# Patient Record
Sex: Female | Born: 1965 | Race: White | Hispanic: No | Marital: Married
Health system: Southern US, Community
[De-identification: ages and names within clinical notes are randomized; demographics above are authoritative.]

---

## 2019-12-01 ENCOUNTER — Emergency Department: Payer: Self-pay

## 2019-12-01 ENCOUNTER — Emergency Department
Admission: EM | Admit: 2019-12-01 | Discharge: 2019-12-01 | Disposition: A | Discharge status: Home / Self Care | Payer: Self-pay | Attending: Emergency Medicine | Admitting: Emergency Medicine

## 2019-12-01 ENCOUNTER — Other Ambulatory Visit: Payer: Self-pay

## 2019-12-01 DIAGNOSIS — Y9301 Activity, walking, marching and hiking: Secondary | ICD-10-CM | POA: Insufficient documentation

## 2019-12-01 DIAGNOSIS — Y92511 Restaurant or cafe as the place of occurrence of the external cause: Secondary | ICD-10-CM | POA: Insufficient documentation

## 2019-12-01 DIAGNOSIS — M47812 Spondylosis without myelopathy or radiculopathy, cervical region: Secondary | ICD-10-CM | POA: Insufficient documentation

## 2019-12-01 DIAGNOSIS — S39012A Strain of muscle, fascia and tendon of lower back, initial encounter: Secondary | ICD-10-CM | POA: Insufficient documentation

## 2019-12-01 DIAGNOSIS — M1812 Unilateral primary osteoarthritis of first carpometacarpal joint, left hand: Secondary | ICD-10-CM | POA: Insufficient documentation

## 2019-12-01 DIAGNOSIS — M47816 Spondylosis without myelopathy or radiculopathy, lumbar region: Secondary | ICD-10-CM | POA: Insufficient documentation

## 2019-12-01 DIAGNOSIS — S8001XA Contusion of right knee, initial encounter: Secondary | ICD-10-CM | POA: Insufficient documentation

## 2019-12-01 DIAGNOSIS — W01198A Fall on same level from slipping, tripping and stumbling with subsequent striking against other object, initial encounter: Secondary | ICD-10-CM | POA: Insufficient documentation

## 2019-12-01 DIAGNOSIS — M19032 Primary osteoarthritis, left wrist: Secondary | ICD-10-CM

## 2019-12-01 MED ORDER — CYCLOBENZAPRINE HCL 5 MG PO TABS: 5.0000 mg | ORAL_TABLET | Freq: Three times a day (TID) | ORAL | 0 refills | Status: AC | PRN

## 2019-12-01 MED ORDER — DEXAMETHASONE SODIUM PHOSPHATE 10 MG/ML IJ SOLN
10.0000 mg | Freq: Once | INTRAMUSCULAR | Status: AC
  Administered 2019-12-01: 10 mg via INTRAVENOUS
  Filled 2019-12-01: qty 1

## 2019-12-01 MED ORDER — HYDROMORPHONE HCL 1 MG/ML IJ SOLN
1.0000 mg | Freq: Once | INTRAMUSCULAR | Status: AC
  Administered 2019-12-01: 1 mg via INTRAVENOUS
  Filled 2019-12-01: qty 1

## 2019-12-01 MED ORDER — ORPHENADRINE CITRATE 30 MG/ML IJ SOLN
60.0000 mg | Freq: Once | INTRAMUSCULAR | Status: AC
  Administered 2019-12-01: 60 mg via INTRAVENOUS
  Filled 2019-12-01: qty 2

## 2019-12-01 MED ORDER — ONDANSETRON HCL 4 MG/2ML IJ SOLN
4.0000 mg | Freq: Once | INTRAMUSCULAR | Status: AC
  Administered 2019-12-01: 4 mg via INTRAVENOUS
  Filled 2019-12-01: qty 2

## 2019-12-01 MED ORDER — OXYCODONE HCL 5 MG PO TABS: 5.0000 mg | ORAL_TABLET | Freq: Three times a day (TID) | ORAL | 0 refills | Status: AC | PRN

## 2019-12-01 NOTE — Discharge Instructions (Signed)
Please apply ice to the right knee, left thumb and neck 20 minutes every hour over the next 2 to 3 days. Use thumb immobilizer as needed for comfort over the next 1 to 2 weeks. Take medications as prescribed. You may use Aleve twice daily for the next 3 days as needed. Call orthopedic office in 1 week if no improvement. Return to the ER for any worsening symptoms or urgent changes in your health.

## 2019-12-01 NOTE — ED Provider Notes (Signed)
[facility] EMERGENCY DEPARTMENT Provider Note   CSN: [id number] Arrival date & time: 12/01/19  1756     History Chief Complaint  Patient presents with  . Fall    Alison Silva is a 54 y.o. female presents to the emergency department for evaluation of fall.  Patient was visiting a local restaurant when she was walking to her table with plates, she slipped on a wet spot on the floor landing on her right knee and her left hand.  She fell backwards injuring her lower back and neck.  She was unable to get up from the scene.  EMS arrived and transported patient to the emergency department.  She denies hitting her head or losing consciousness.  No nausea or vomiting.  She is not on blood thinners.  No photophobia or vision changes.  She complains of severe neck pain, lower back pain, left hand pain along the base of the thumb as well as right knee pain.  She denies any ankle pain, groin pain, thoracic back pain, chest pain, shortness of breath or abdominal pain.  No numbness tingling radicular symptoms in the upper or lower extremities.  She has not any medications for pain.  HPI     No past medical history on file.  There are no problems to display for this patient.      OB History   No obstetric history on file.     No family history on file.  Social History   Tobacco Use  . Smoking status: Not on file  Substance Use Topics  . Alcohol use: Not on file  . Drug use: Not on file    Home Medications Prior to Admission medications   Medication Sig Start Date End Date Taking? Authorizing Provider  cyclobenzaprine (FLEXERIL) 5 MG tablet Take 1-2 tablets (5-10 mg total) by mouth 3 (three) times daily as needed for muscle spasms. 12/01/19   Evon Slack, PA-C  oxyCODONE (ROXICODONE) 5 MG immediate release tablet Take 1 tablet (5 mg total) by mouth every 8 (eight) hours as needed. 12/01/19 11/30/20  Evon Slack, PA-C    Allergies    Codeine, Demerol  [meperidine hcl], and Penicillins  Review of Systems   Review of Systems  Respiratory: Negative for shortness of breath.   Cardiovascular: Negative for chest pain and leg swelling.  Gastrointestinal: Negative for abdominal pain, nausea and vomiting.  Genitourinary: Negative for flank pain.  Musculoskeletal: Positive for arthralgias, back pain, gait problem, myalgias and neck pain. Negative for joint swelling and neck stiffness.  Skin: Negative for rash and wound.  Neurological: Negative for numbness and headaches.  Psychiatric/Behavioral: Negative for confusion and decreased concentration.    Physical Exam Updated Vital Signs BP 119/72 (BP Location: Right Arm)   Pulse 73   Temp 98 F (36.7 C) (Oral)   Resp 19   Ht 5' 5.5" (1.664 m)   Wt 81.6 kg   SpO2 95%   BMI 29.50 kg/m   Physical Exam Constitutional:      Appearance: She is well-developed.  HENT:     Head: Normocephalic and atraumatic.     Right Ear: External ear normal.     Left Ear: External ear normal.     Nose: Nose normal.  Eyes:     Extraocular Movements: Extraocular movements intact.     Conjunctiva/sclera: Conjunctivae normal.     Pupils: Pupils are equal, round, and reactive to light.  Cardiovascular:     Rate and Rhythm:  Normal rate.  Pulmonary:     Effort: Pulmonary effort is normal. No respiratory distress.  Abdominal:     General: There is no distension.     Tenderness: There is no abdominal tenderness. There is no guarding.  Musculoskeletal:        General: Tenderness and signs of injury present.  Skin:    General: Skin is warm.     Findings: No rash.  Neurological:     General: No focal deficit present.     Mental Status: She is alert and oriented to person, place, and time. Mental status is at baseline.     Cranial Nerves: No cranial nerve deficit.  Psychiatric:        Mood and Affect: Mood normal.        Behavior: Behavior normal.        Thought Content: Thought content normal.     ED  Results / Procedures / Treatments   Labs (all labs ordered are listed, but only abnormal results are displayed) Labs Reviewed - No data to display  EKG None  Radiology DG Lumbar Spine 2-3 Views  Result Date: 12/01/2019 CLINICAL DATA:  Knee pain after a fall EXAM: LUMBAR SPINE - 2-3 VIEW COMPARISON:  None. FINDINGS: There is no evidence of lumbar spine fracture. Alignment is normal. Mild disc height loss with facet arthrosis seen at L4-L5 and L5-S1. Overlying scattered vascular calcifications are noted. IMPRESSION: Mild lower lumbar spine spondylosis.  No acute fracture. Electronically Signed   By: Jonna Clark M.D.   On: 12/01/2019 22:07   CT Cervical Spine Wo Contrast  Result Date: 12/01/2019 CLINICAL DATA:  Fall, pain EXAM: CT CERVICAL SPINE WITHOUT CONTRAST TECHNIQUE: Multidetector CT imaging of the cervical spine was performed without intravenous contrast. Multiplanar CT image reconstructions were also generated. COMPARISON:  None. FINDINGS: Alignment: There is slight reversal of the normal cervical lordosis. A minimal anterolisthesis of C3 on C4 is noted measuring 2 mm. There is also a minimal anterolisthesis of C4 on C5 measuring 3 mm. Skull base and vertebrae: Visualized skull base is intact. No atlanto-occipital dissociation. The vertebral body heights are well maintained. No fracture or pathologic osseous lesion seen. Soft tissues and spinal canal: The visualized paraspinal soft tissues are unremarkable. No prevertebral soft tissue swelling is seen. The spinal canal is grossly unremarkable, no large epidural collection or significant canal narrowing. Disc levels: Multilevel cervical spine spondylosis is seen with disc osteophyte complex and uncovertebral osteophytes most notable at C4-C5 and C5-C6 with moderate to severe neural foraminal narrowing and mild central canal stenosis. Upper chest: Mild ground-glass opacity seen at the right lung apex likely atelectasis or scarring. Thoracic inlet  is within normal limits. Other: None IMPRESSION: No acute fracture or malalignment of the cervical spine. Minimal anterolisthesis C3 on C4 and C4 on C5, likely degenerative. Cervical spine spondylosis most notable from C4 through C6. Electronically Signed   By: Jonna Clark M.D.   On: 12/01/2019 20:28   DG Knee Complete 4 Views Right  Result Date: 12/01/2019 CLINICAL DATA:  Right knee pain after fall EXAM: RIGHT KNEE - COMPLETE 4+ VIEW COMPARISON:  None. FINDINGS: No evidence of fracture, dislocation, or joint effusion. No evidence of arthropathy or other focal bone abnormality. Soft tissues are unremarkable. IMPRESSION: Negative. Electronically Signed   By: Jonna Clark M.D.   On: 12/01/2019 22:08   DG Hand Complete Left  Result Date: 12/01/2019 CLINICAL DATA:  Fall, left hand pain EXAM: LEFT HAND - COMPLETE 3+  VIEW COMPARISON:  None. FINDINGS: Three view radiograph left hand is slightly limited by metallic artifact due to multiple rings. There is, however, normal alignment. No definite fracture or dislocation. Advanced degenerative changes are seen at the base of the thumb involving the first [facility] joint. Scapholunate interval is not well profiled and not well assessed on this exam. Remaining joint spaces appear preserved. Soft tissues are unremarkable. IMPRESSION: No acute fracture or dislocation. Electronically Signed   By: Helyn Numbers MD   On: 12/01/2019 22:09    Procedures Procedures (including critical care time)  Medications Ordered in ED Medications  HYDROmorphone (DILAUDID) injection 1 mg (1 mg Intravenous Given 12/01/19 1918)  ondansetron (ZOFRAN) injection 4 mg (4 mg Intravenous Given 12/01/19 1918)  orphenadrine (NORFLEX) injection 60 mg (60 mg Intravenous Given 12/01/19 2050)  dexamethasone (DECADRON) injection 10 mg (10 mg Intravenous Given 12/01/19 2225)  HYDROmorphone (DILAUDID) injection 1 mg (1 mg Intravenous Given 12/01/19 2226)    ED Course  I have reviewed the triage vital  signs and the nursing notes.  Pertinent labs & imaging results that were available during my care of the patient were reviewed by me and considered in my medical decision making (see chart for details).    MDM Rules/Calculators/A&P                          54 year old female with fall earlier today. Patient slipped injuring her left thumb, right knee and her neck and lower back. All x-rays and scans negative for any acute fracture. She did have some underlying arthritic changes in her neck lower back and advanced arthritis in her left thumb CMC joint. She is given prescription for oxycodone, Flexeril and will take Aleve as tolerated. She is placed into a thumb spica splint. She will follow-up with orthopedics. She understands signs symptoms return to the ER for. Final Clinical Impression(s) / ED Diagnoses Final diagnoses:  Cervical spondylosis  Lumbar spondylosis  Localized primary osteoarthritis of carpometacarpal (CMC) joint of left wrist  Contusion of right knee, initial encounter  Strain of lumbar region, initial encounter    Rx / DC Orders ED Discharge Orders         Ordered    oxyCODONE (ROXICODONE) 5 MG immediate release tablet  Every 8 hours PRN        12/01/19 2243    cyclobenzaprine (FLEXERIL) 5 MG tablet  3 times daily PRN        12/01/19 2243           Evon Slack, PA-C 12/01/19 2246    Shaune Pollack, MD 12/04/19 [phone]

## 2019-12-01 NOTE — ED Triage Notes (Signed)
Pt to the er for injuries sustained in a fall at the [employer]. Pt was walking carrying plates when her left foot hit something wet in the floor and her foot went out causing pt to fall landing on the right knee and hitting the left hand and then the back and her neck. Pt has pain to all the above areas. Neck brace in place.

## 2019-12-01 NOTE — ED Triage Notes (Signed)
First nurse note- knee and wrist pain after falling at restaurant.  Per EMS unable to bed knee. VSS

## 2021-09-11 IMAGING — CR DG LUMBAR SPINE 2-3V
3 series · 3 of 3 positions shown · non-contrast
Comparison: None.

CLINICAL DATA: Knee pain after a fall

EXAM:
LUMBAR SPINE - 2-3 VIEW

[l-spine ap]
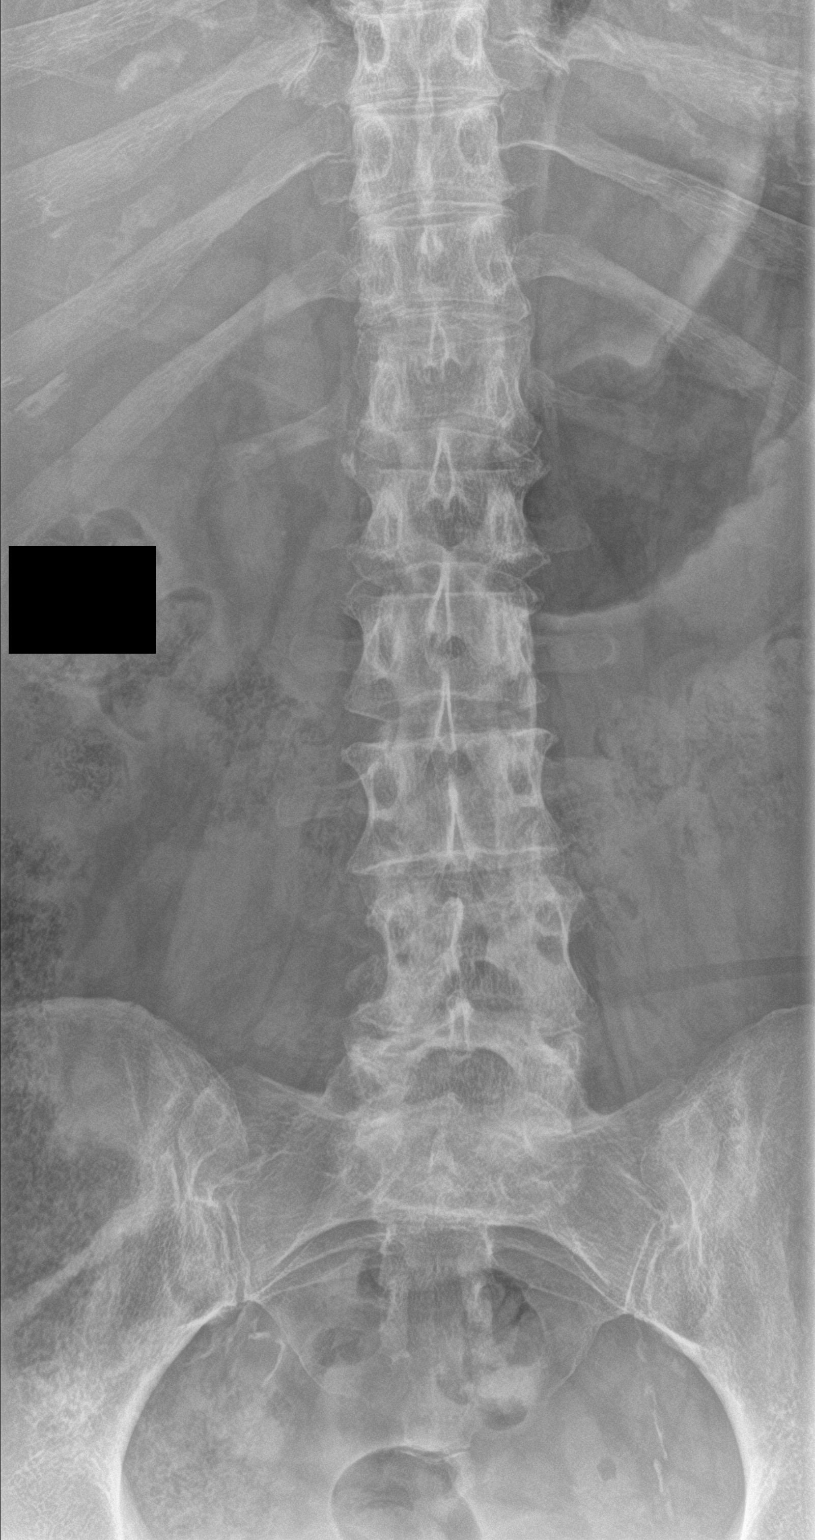

[l-spine lat]
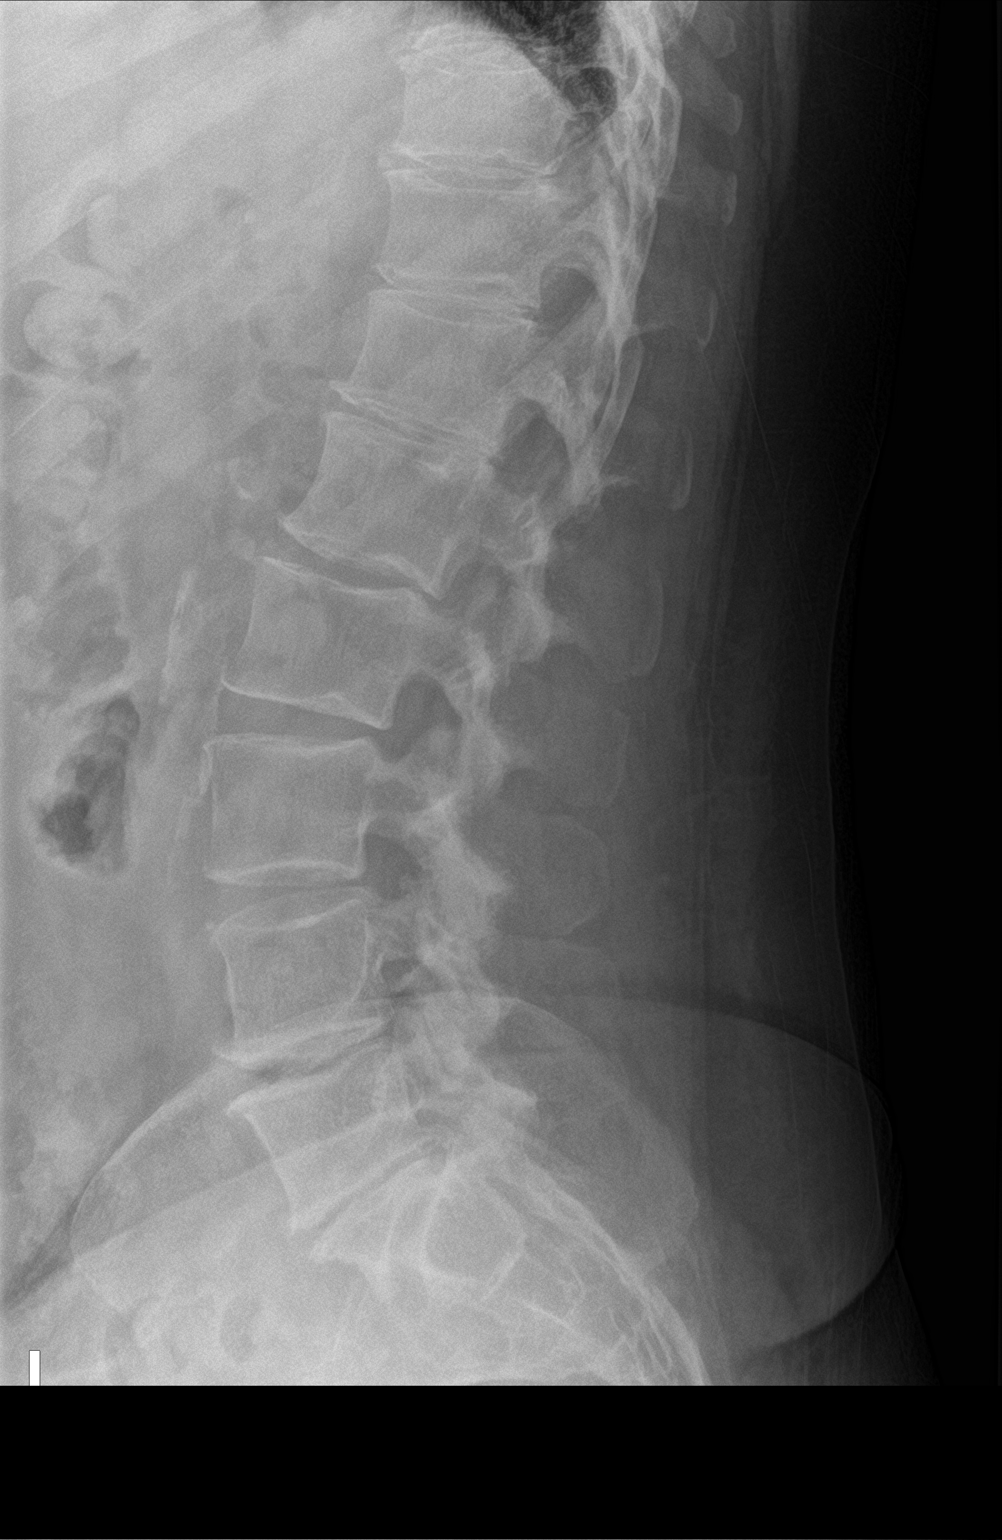

[l-spine spot]
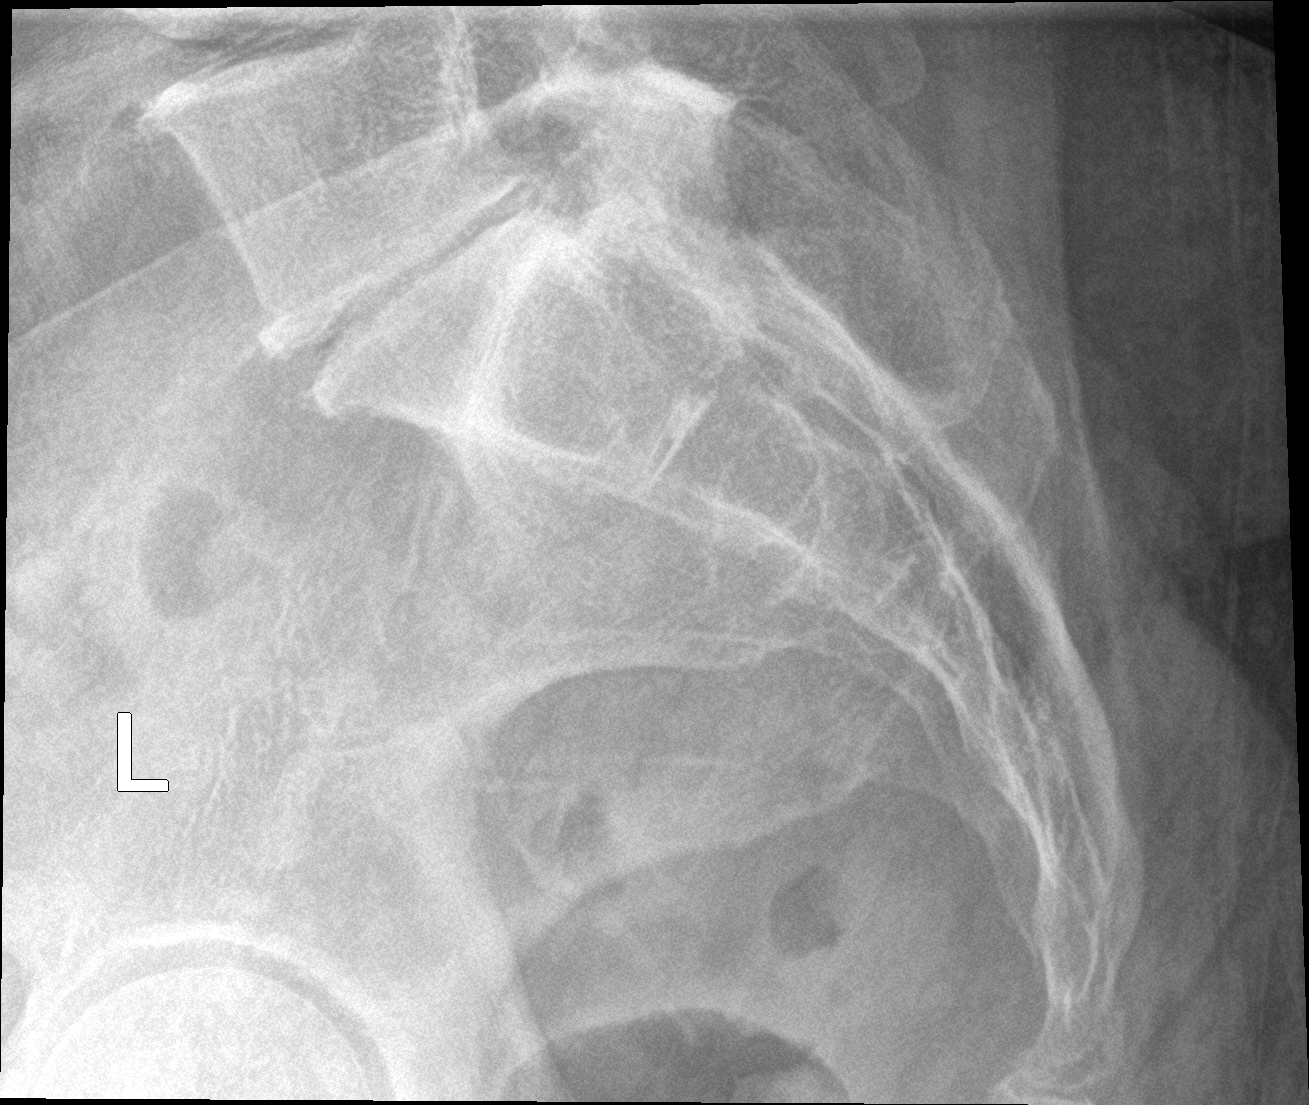

[3 of 3 positions shown; findings below may reference images not displayed]

FINDINGS: There is no evidence of lumbar spine fracture. Alignment is normal.
Mild disc height loss with facet arthrosis seen at L4-L5 and L5-S1.
Overlying scattered vascular calcifications are noted.
IMPRESSION: Mild lower lumbar spine spondylosis.  No acute fracture.

## 2021-09-11 IMAGING — CR DG KNEE COMPLETE 4+V*R*
4 series · 4 of 4 positions shown · non-contrast
Comparison: None.

CLINICAL DATA: Right knee pain after fall

EXAM:
RIGHT KNEE - COMPLETE 4+ VIEW

[knee ap]
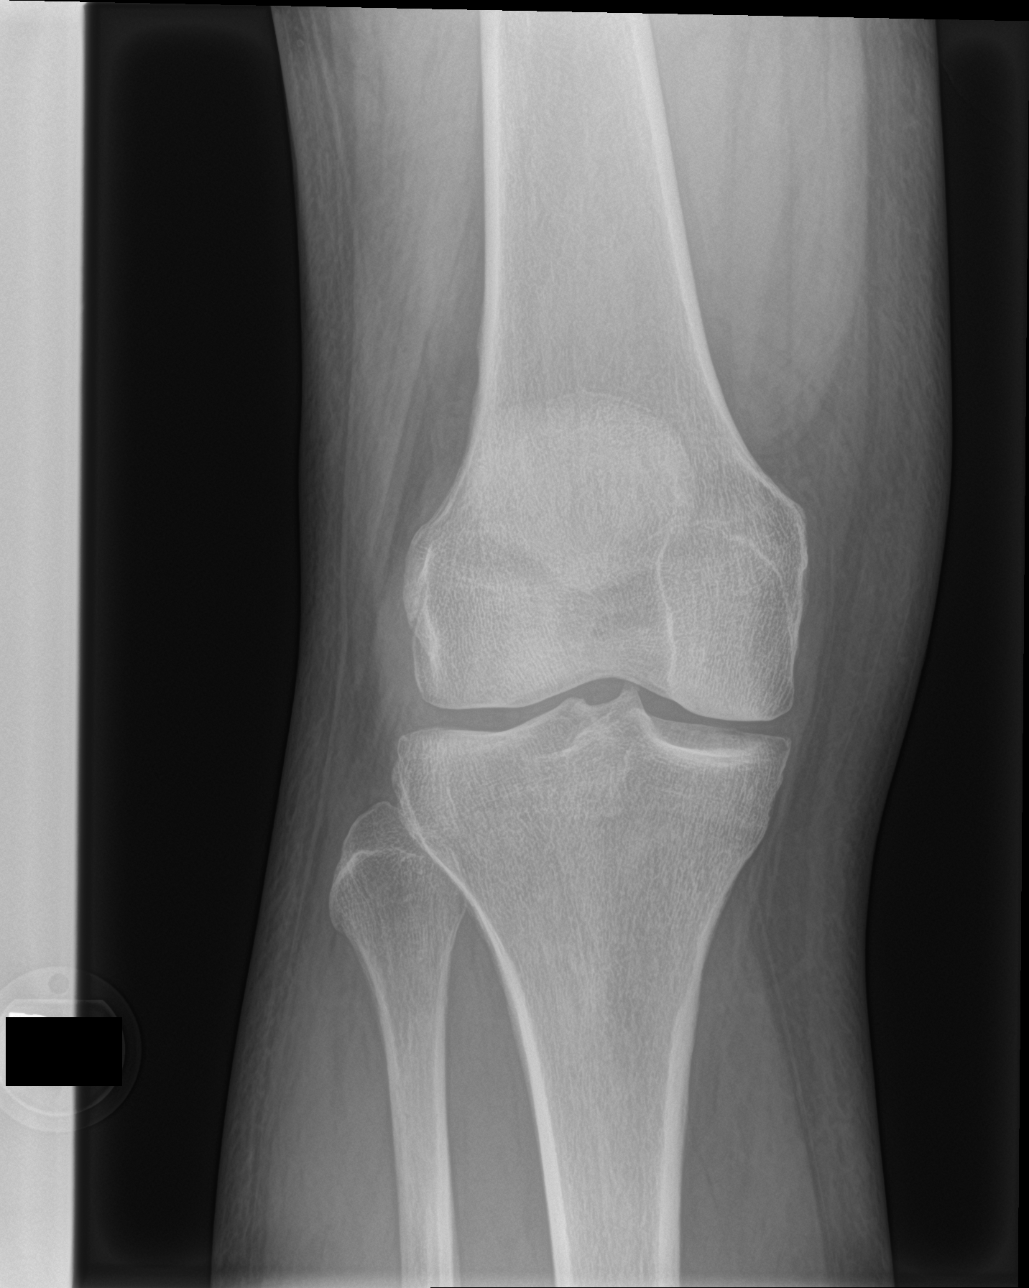

[knee obl (1 of 2)]
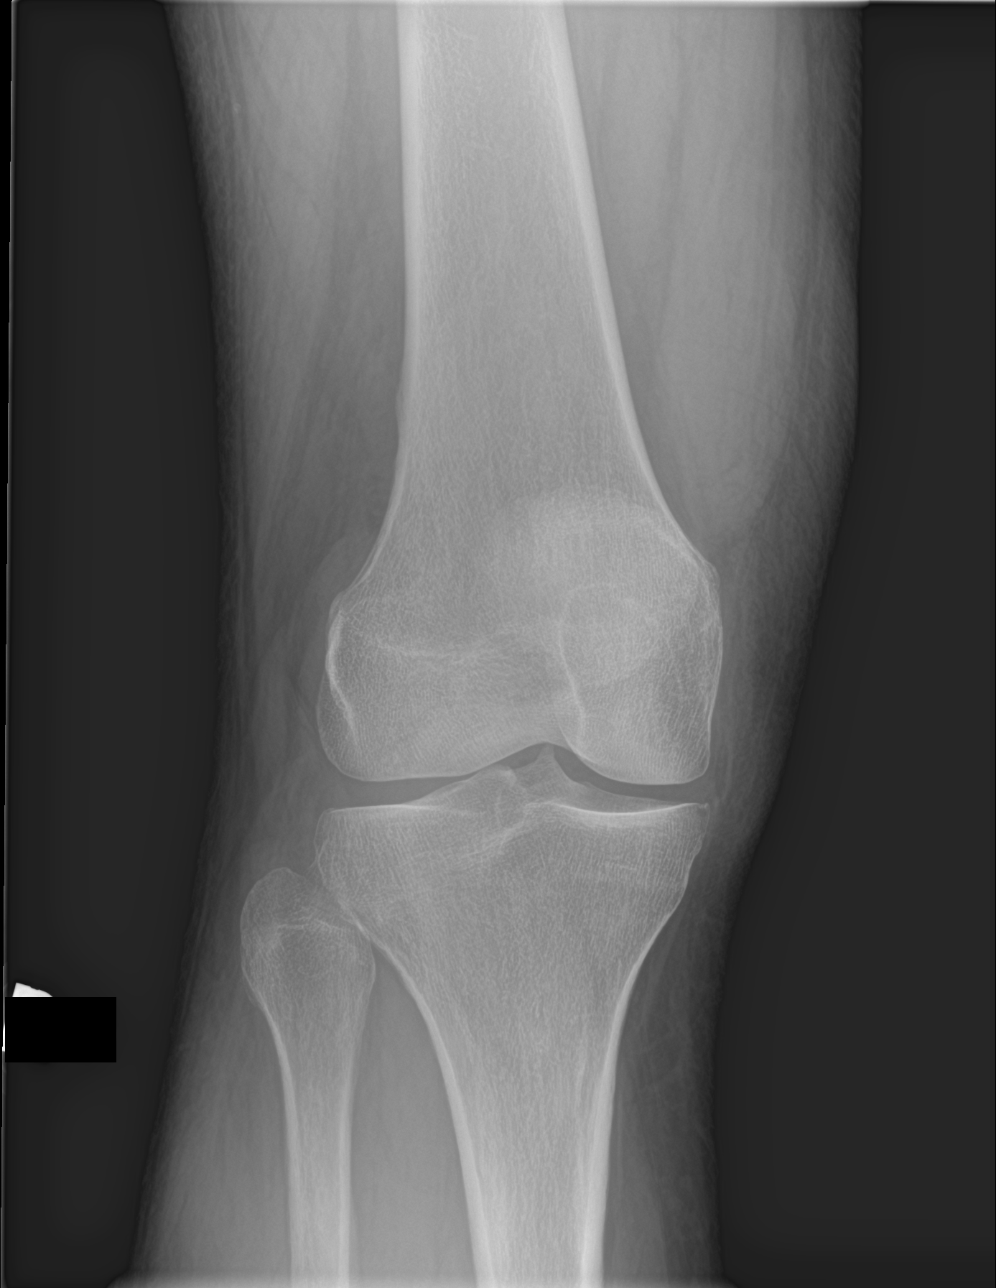

[knee obl (2 of 2)]
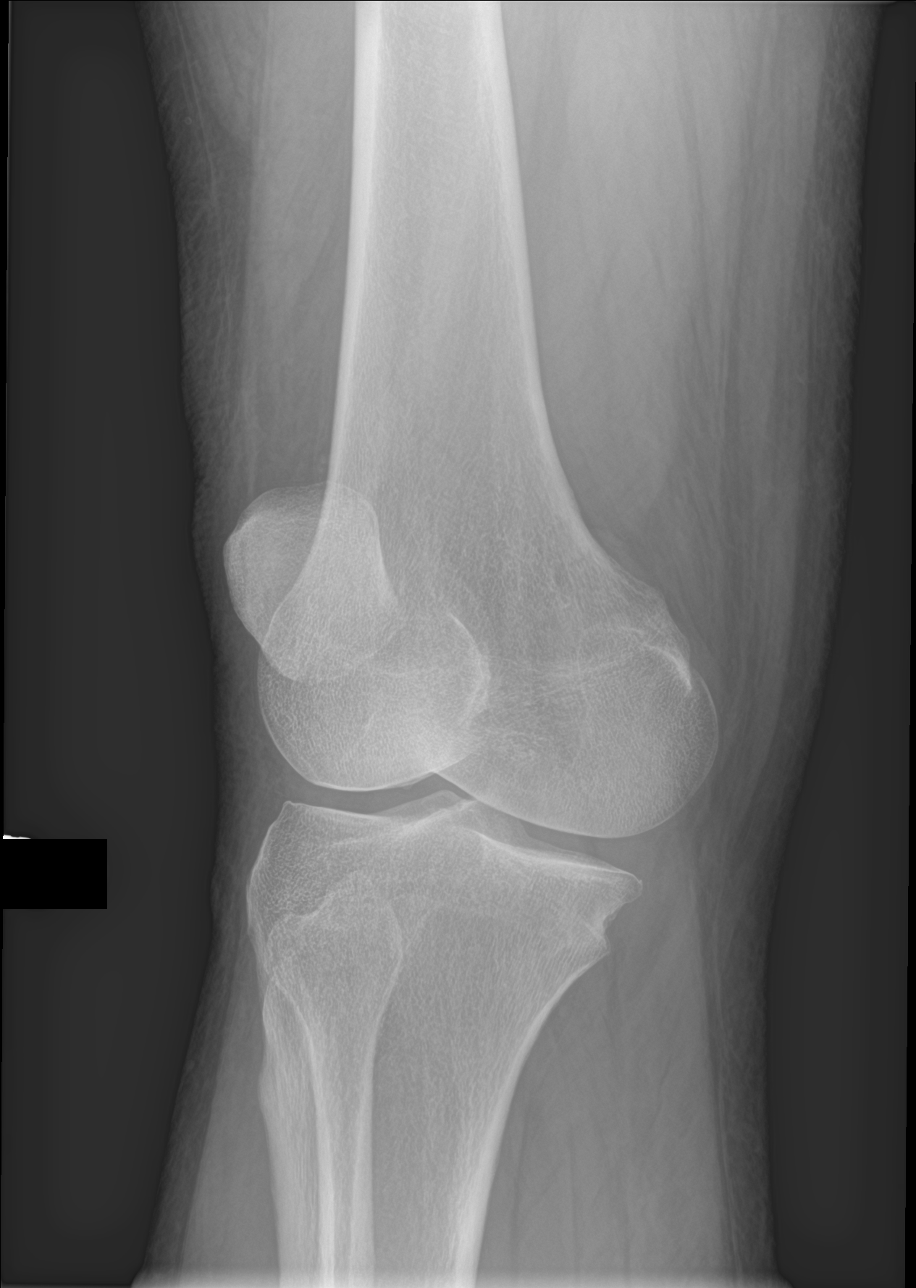

[knee lat]
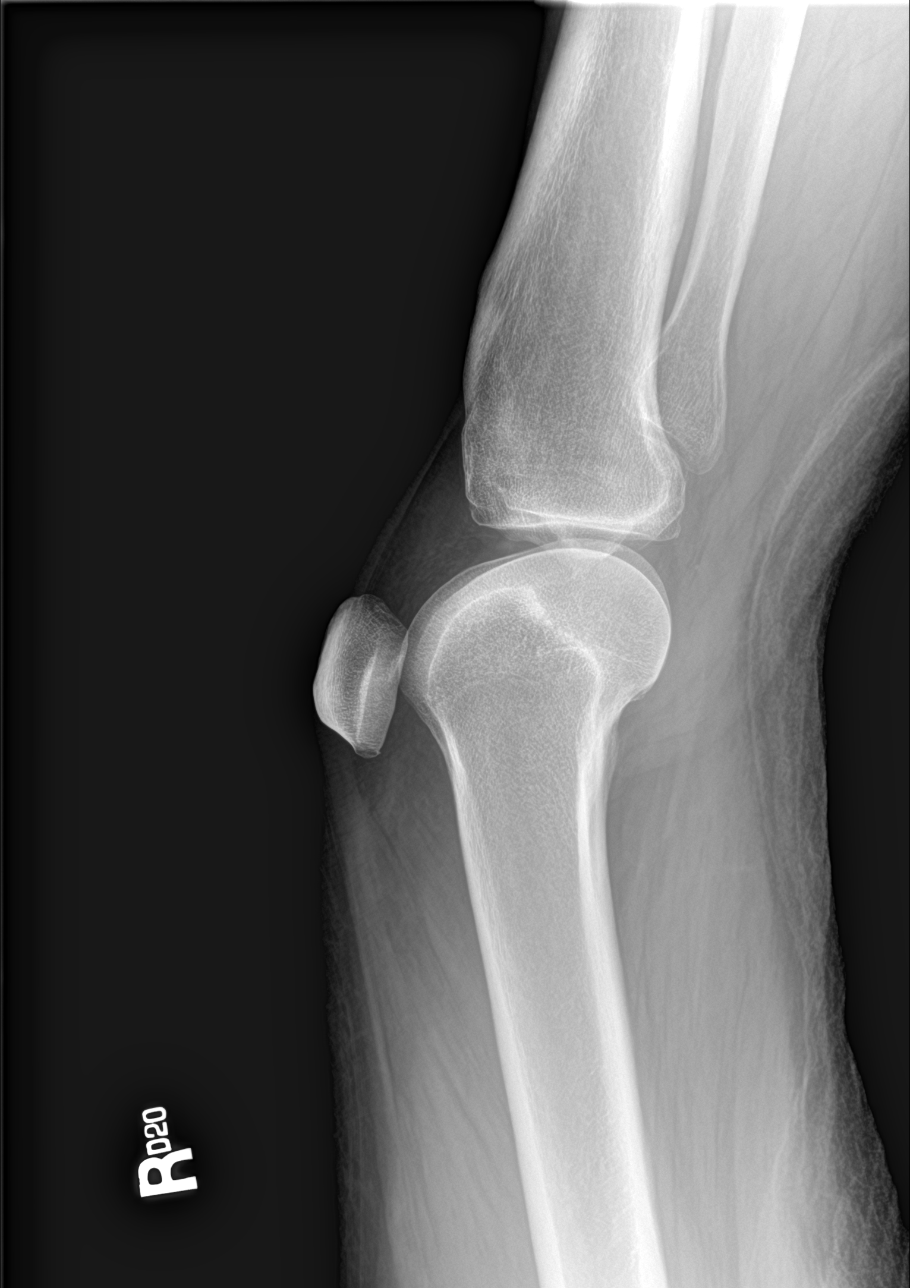

[4 of 4 positions shown; findings below may reference images not displayed]

FINDINGS: No evidence of fracture, dislocation, or joint effusion. No evidence
of arthropathy or other focal bone abnormality. Soft tissues are
unremarkable.
IMPRESSION: Negative.

## 2021-09-11 IMAGING — CR DG HAND COMPLETE 3+V*L*
3 series · 3 of 3 positions shown · non-contrast
Comparison: None.

CLINICAL DATA: Fall, left hand pain

EXAM:
LEFT HAND - COMPLETE 3+ VIEW

[hand ap]
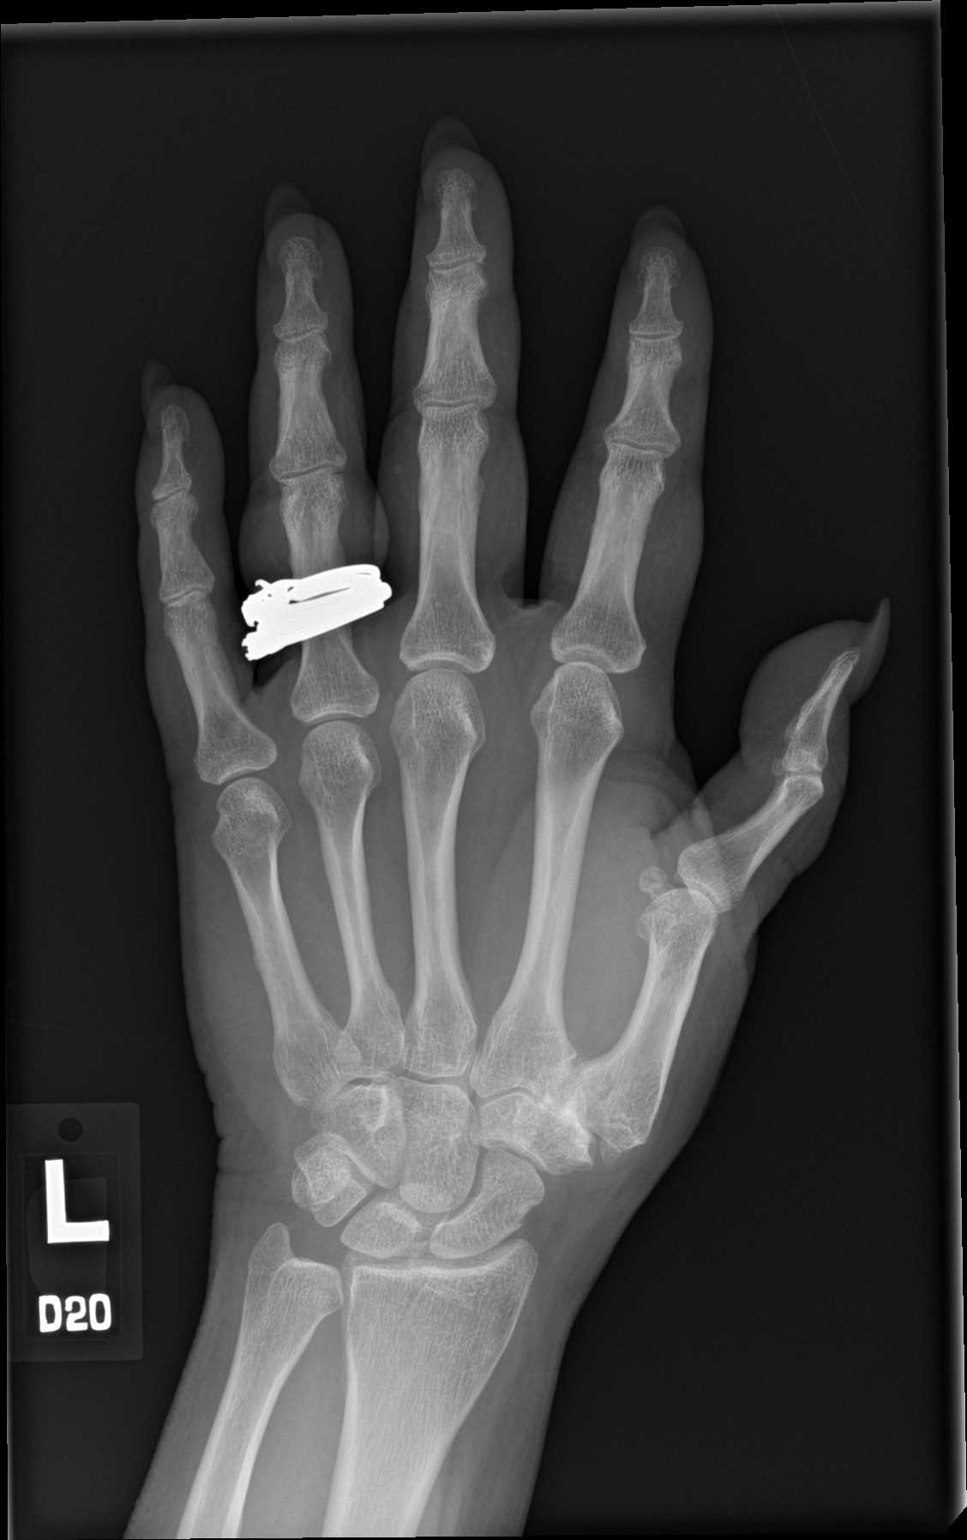

[hand obl]
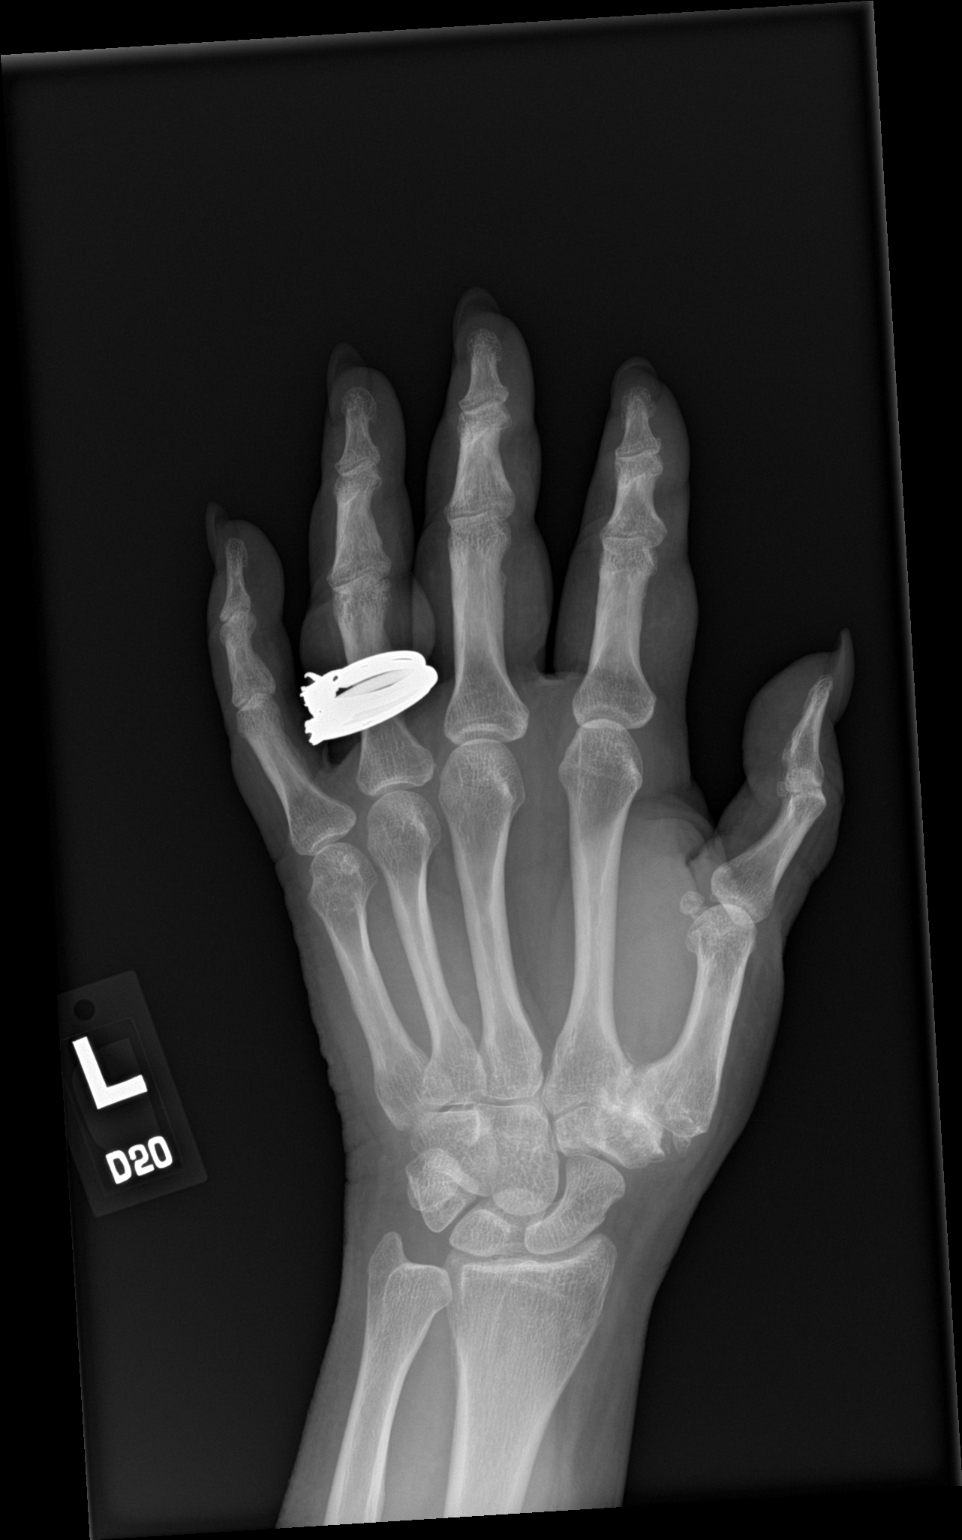

[hand lat]
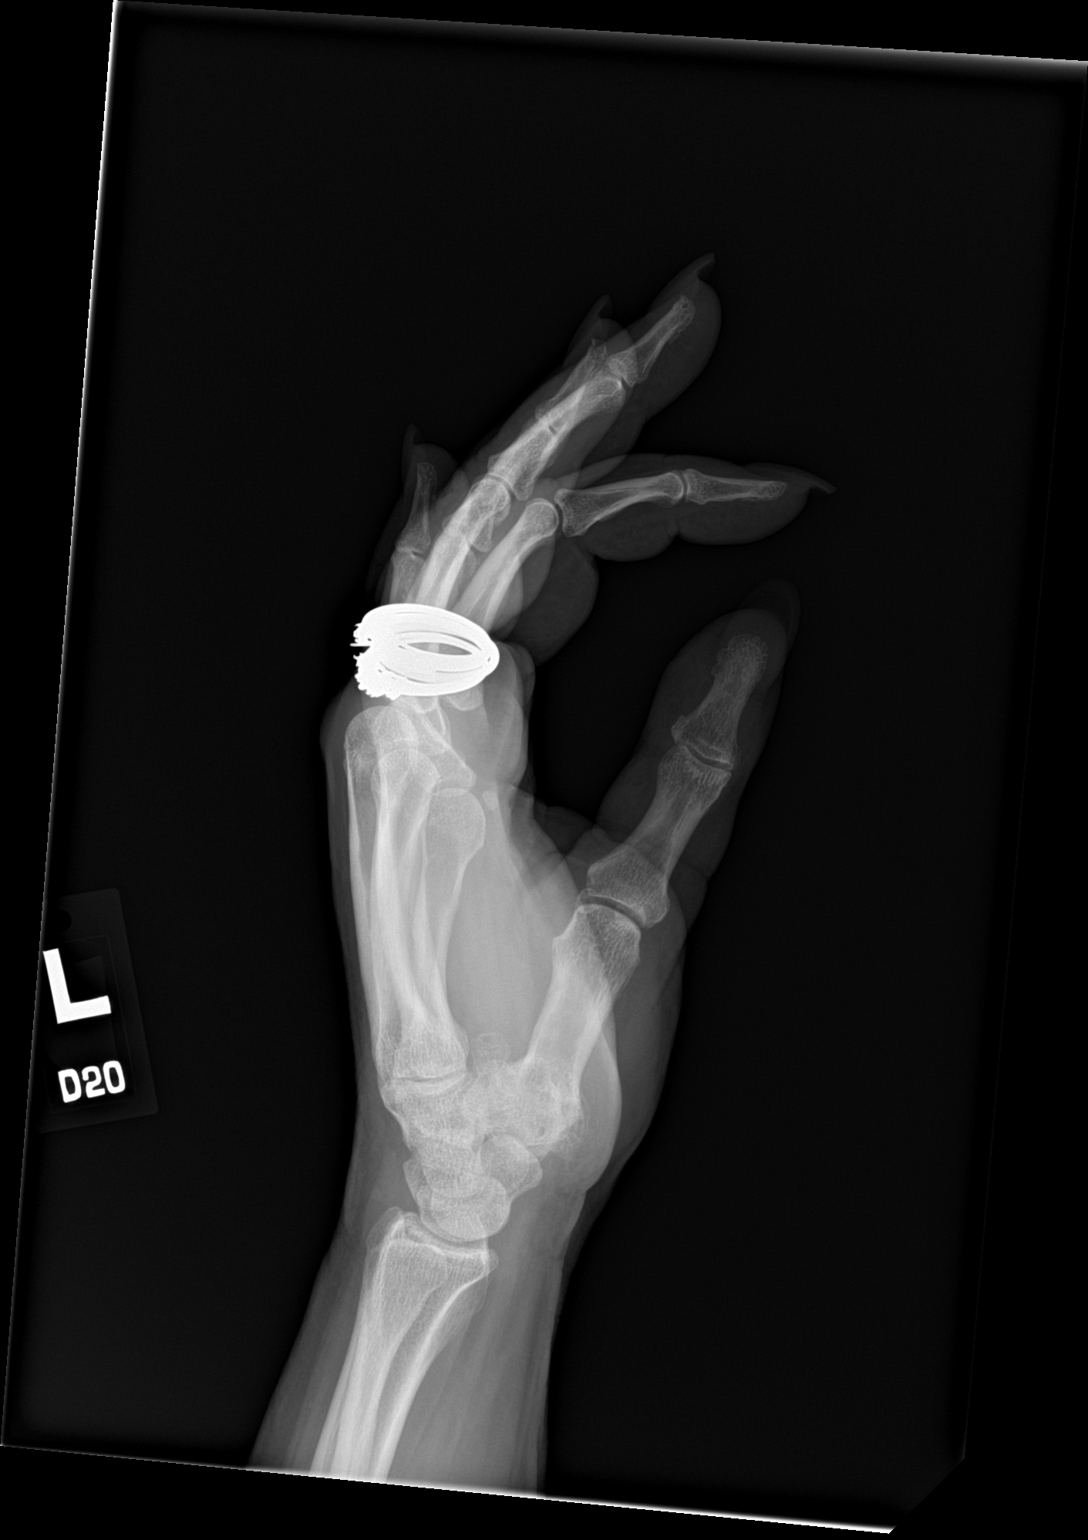

[3 of 3 positions shown; findings below may reference images not displayed]

FINDINGS: Three view radiograph left hand is slightly limited by metallic
artifact due to multiple rings. There is, however, normal alignment.
No definite fracture or dislocation. Advanced degenerative changes
are seen at the base of the thumb involving the first CMC joint.
Scapholunate interval is not well profiled and not well assessed on
this exam. Remaining joint spaces appear preserved. Soft tissues are
unremarkable.
IMPRESSION: No acute fracture or dislocation.
